# Patient Record
Sex: Female | Born: 1990 | Race: Black or African American | Hispanic: No | Marital: Single | Smoking: Never smoker
Health system: Southern US, Community
[De-identification: ages and names within clinical notes are randomized; demographics above are authoritative.]

---

## 2019-12-05 ENCOUNTER — Emergency Department (HOSPITAL_COMMUNITY): Payer: Medicaid Other

## 2019-12-05 ENCOUNTER — Emergency Department (HOSPITAL_COMMUNITY)
Admission: EM | Admit: 2019-12-05 | Discharge: 2019-12-05 | Disposition: A | Discharge status: Home / Self Care | Payer: Medicaid Other | Attending: Emergency Medicine | Admitting: Emergency Medicine

## 2019-12-05 ENCOUNTER — Encounter (HOSPITAL_COMMUNITY): Payer: Self-pay | Admitting: Emergency Medicine

## 2019-12-05 ENCOUNTER — Other Ambulatory Visit: Payer: Self-pay

## 2019-12-05 DIAGNOSIS — M25572 Pain in left ankle and joints of left foot: Secondary | ICD-10-CM | POA: Insufficient documentation

## 2019-12-05 NOTE — ED Provider Notes (Signed)
[facility] EMERGENCY DEPARTMENT Provider Note   CSN: [id number] Arrival date & time: 12/05/19  1638     History Chief Complaint  Patient presents with  . Ankle Pain    Dawn Bender is a 28 y.o. female.  Patient presents with persistent left ankle pain since slipping and falling in the rain yesterday.  No other injuries.  Pain with walking and difficulty bearing weight.        History reviewed. No pertinent past medical history.  There are no problems to display for this patient.   History reviewed. No pertinent surgical history.   OB History   No obstetric history on file.     No family history on file.  Social History   Tobacco Use  . Smoking status: Never Smoker  . Smokeless tobacco: Never Used  Substance Use Topics  . Alcohol use: Never  . Drug use: Never    Home Medications Prior to Admission medications   Not on File    Allergies    Patient has no allergy information on record.  Review of Systems   Review of Systems  Cardiovascular: Negative for chest pain.  Gastrointestinal: Negative for abdominal pain.  Genitourinary: Negative for dysuria and flank pain.  Musculoskeletal: Positive for gait problem and joint swelling. Negative for back pain, neck pain and neck stiffness.  Skin: Negative for rash.  Neurological: Negative for weakness, light-headedness and headaches.    Physical Exam Updated Vital Signs BP 120/88 (BP Location: Right Arm)   Pulse 90   Temp 98.5 F (36.9 C) (Oral)   Resp 14   Ht 5' (1.524 m)   Wt 70.3 kg   LMP 11/11/2019   SpO2 99%   BMI 30.27 kg/m   Physical Exam Vitals and nursing note reviewed.  Cardiovascular:     Rate and Rhythm: Normal rate.  Musculoskeletal:        General: Swelling, tenderness and signs of injury present. No deformity.     Comments: Patient has tenderness mild swelling left lateral malleoli.  No deformity.  No tenderness to proximal tibia or foot.  Neurovascularly  intact.  Skin:    General: Skin is warm.  Neurological:     General: No focal deficit present.     Mental Status: She is alert.  Psychiatric:        Mood and Affect: Mood normal.     ED Results / Procedures / Treatments   Labs (all labs ordered are listed, but only abnormal results are displayed) Labs Reviewed - No data to display  EKG None  Radiology DG Ankle Complete Left  Result Date: 12/05/2019 CLINICAL DATA:  Left ankle injury. EXAM: LEFT ANKLE COMPLETE - 3+ VIEW COMPARISON:  None. FINDINGS: No fracture. No subluxation or dislocation. Ankle mortise is preserved. Mild lateral soft tissue swelling evident. IMPRESSION: Negative. Electronically Signed   By: Misty Stanley M.D.   On: 12/05/2019 17:25    Procedures Procedures (including critical care time)  Medications Ordered in ED Medications - No data to display  ED Course  I have reviewed the triage vital signs and the nursing notes.  Pertinent labs & imaging results that were available during my care of the patient were reviewed by me and considered in my medical decision making (see chart for details).    MDM Rules/Calculators/A&P                     Well-appearing patient with isolated left ankle injury.  X-ray reviewed no acute fracture.  Supportive care crutches given in the ER.  Ace wrap.   Final Clinical Impression(s) / ED Diagnoses Final diagnoses:  Acute left ankle pain    Rx / DC Orders ED Discharge Orders    None       Blane Ohara, MD 12/05/19 2223

## 2019-12-05 NOTE — Discharge Instructions (Addendum)
Use crutches as needed until pain controlled.  Use ice, elevate when sitting, Motrin and Tylenol for pain.  Gradually increase weightbearing.  If no improvement in 1 week see a clinician.

## 2019-12-05 NOTE — ED Notes (Signed)
Ortho tech at bedside 

## 2019-12-05 NOTE — Progress Notes (Signed)
Orthopedic Tech Progress Note Patient Details:  Dawn Bender 1991-06-04 [id number]  Ortho Devices Type of Ortho Device: Crutches Ortho Device/Splint Interventions: Ordered, Application, Adjustment   Post Interventions Patient Tolerated: Well Instructions Provided: Care of device, Adjustment of device   Karolee Stamps 12/05/2019, 10:19 PM

## 2019-12-05 NOTE — ED Triage Notes (Signed)
C/o left ankle pain after slipping and falling in the rain yesterday.  Has not taken anything for pain.

## 2020-01-13 ENCOUNTER — Emergency Department (HOSPITAL_COMMUNITY)
Admission: EM | Admit: 2020-01-13 | Discharge: 2020-01-13 | Disposition: A | Discharge status: Home / Self Care | Payer: Medicaid Other | Attending: Emergency Medicine | Admitting: Emergency Medicine

## 2020-01-13 ENCOUNTER — Encounter (HOSPITAL_COMMUNITY): Payer: Self-pay | Admitting: *Deleted

## 2020-01-13 ENCOUNTER — Other Ambulatory Visit: Payer: Self-pay

## 2020-01-13 DIAGNOSIS — L0291 Cutaneous abscess, unspecified: Secondary | ICD-10-CM

## 2020-01-13 DIAGNOSIS — R2231 Localized swelling, mass and lump, right upper limb: Secondary | ICD-10-CM | POA: Diagnosis present

## 2020-01-13 DIAGNOSIS — L02411 Cutaneous abscess of right axilla: Secondary | ICD-10-CM | POA: Insufficient documentation

## 2020-01-13 MED ORDER — HYDROCODONE-ACETAMINOPHEN 5-325 MG PO TABS
1.0000 | ORAL_TABLET | Freq: Once | ORAL | Status: AC
  Administered 2020-01-13: 1 via ORAL
  Filled 2020-01-13: qty 1

## 2020-01-13 MED ORDER — BUPIVACAINE HCL (PF) 0.5 % IJ SOLN
20.0000 mL | Freq: Once | INTRAMUSCULAR | Status: AC
  Administered 2020-01-13: 20 mL
  Filled 2020-01-13: qty 30

## 2020-01-13 MED ORDER — DOXYCYCLINE HYCLATE 100 MG PO CAPS: 100.0000 mg | ORAL_CAPSULE | Freq: Two times a day (BID) | ORAL | 0 refills | Status: DC

## 2020-01-13 MED ORDER — DOXYCYCLINE HYCLATE 100 MG PO CAPS: 100.0000 mg | ORAL_CAPSULE | Freq: Two times a day (BID) | ORAL | 0 refills | Status: AC

## 2020-01-13 NOTE — ED Notes (Signed)
Pt verbalizes understanding of DC instructions. Pt belongings returned and is ambulatory out of ED.  

## 2020-01-13 NOTE — ED Triage Notes (Signed)
Abscess right axilla x 9 days

## 2020-01-13 NOTE — ED Provider Notes (Addendum)
[facility]-EMERGENCY DEPT Provider Note   CSN: [id number] Arrival date & time: 01/13/20  1745     History Chief Complaint  Patient presents with  . Abscess    Dawn Bender is a 29 y.o. female.  HPI  Patient is 29 year old female with no significant past medical history presented with 9 days of worsening right axial lump that she states is painful, worse with touch, worse with movement of her arm.  Denies any discharge or bleeding.  Denies any history of abscesses in her armpit however states that she has had 5-10 abscesses in the past.  She has never had antibiotics or incision and drainage in the past she states that which is present around.  She states that this morning does not seem to be doing so.  She is up-to-date on tetanus.  She denies any history of immunosuppressive disease HIV, steroid use or cancer.  She states she is otherwise feeling well denies any fevers, chills, nausea, vomiting, diarrhea, headaches, dizziness, abdominal pain.     History reviewed. No pertinent past medical history.  There are no problems to display for this patient.   History reviewed. No pertinent surgical history.   OB History   No obstetric history on file.     No family history on file.  Social History   Tobacco Use  . Smoking status: Never Smoker  . Smokeless tobacco: Never Used  Substance Use Topics  . Alcohol use: Never  . Drug use: Never    Home Medications Prior to Admission medications   Medication Sig Start Date End Date Taking? Authorizing Provider  doxycycline (VIBRAMYCIN) 100 MG capsule Take 1 capsule (100 mg total) by mouth 2 (two) times daily for 7 days. 01/13/20 01/20/20  Gailen Shelter, PA    Allergies    Patient has no known allergies.  Review of Systems   Review of Systems  Constitutional: Negative for chills and fever.  HENT: Negative for congestion.   Eyes: Negative for pain.  Respiratory: Negative for cough and shortness of breath.    Cardiovascular: Negative for chest pain and leg swelling.  Gastrointestinal: Negative for abdominal pain and vomiting.  Genitourinary: Negative for dysuria.  Musculoskeletal: Negative for myalgias.  Skin: Negative for rash.       Abscess of right axilla  Neurological: Negative for dizziness and headaches.    Physical Exam Updated Vital Signs BP 128/74 (BP Location: Left Arm)   Pulse 70   Temp 98.2 F (36.8 C) (Oral)   Resp 16   Ht 5' (1.524 m)   Wt 68 kg   LMP 01/06/2020   SpO2 100%   BMI 29.29 kg/m   Physical Exam Vitals and nursing note reviewed.  Constitutional:      General: She is not in acute distress. HENT:     Head: Normocephalic and atraumatic.     Nose: Nose normal.     Mouth/Throat:     Mouth: Mucous membranes are moist.  Eyes:     General: No scleral icterus. Cardiovascular:     Rate and Rhythm: Normal rate and regular rhythm.     Pulses: Normal pulses.     Heart sounds: Normal heart sounds.  Pulmonary:     Effort: Pulmonary effort is normal. No respiratory distress.     Breath sounds: No wheezing.  Abdominal:     Palpations: Abdomen is soft.     Tenderness: There is no abdominal tenderness.  Musculoskeletal:     Cervical  back: Normal range of motion.     Right lower leg: No edema.     Left lower leg: No edema.  Skin:    General: Skin is warm and dry.     Capillary Refill: Capillary refill takes less than 2 seconds.     Comments: Patient has a right axillary lesion that is approximately 4 cm in diameter.  This area is tender to palpation.  No redness or erythema.  Area is fluctuant.  Neurological:     Mental Status: She is alert. Mental status is at baseline.  Psychiatric:        Mood and Affect: Mood normal.        Behavior: Behavior normal.     ED Results / Procedures / Treatments   Labs (all labs ordered are listed, but only abnormal results are displayed) Labs Reviewed - No data to display  EKG None  Radiology No results  found.  Procedures .Marland KitchenIncision and Drainage  Date/Time: 01/24/2020 9:05 AM Performed by: Tedd Sias, PA Authorized by: Tedd Sias, PA   Consent:    Consent obtained:  Verbal   Consent given by:  Patient   Risks discussed:  Bleeding, incomplete drainage, pain and damage to other organs   Alternatives discussed:  No treatment Universal protocol:    Procedure explained and questions answered to patient or proxy's satisfaction: yes     Relevant documents present and verified: yes     Test results available and properly labeled: yes     Imaging studies available: yes     Required blood products, implants, devices, and special equipment available: yes     Site/side marked: yes     Immediately prior to procedure a time out was called: yes     Patient identity confirmed:  Verbally with patient and arm band Location:    Type:  Abscess   Size:  4cm x 4cm   Location: Right axilla. Pre-procedure details:    Skin preparation:  Betadine Anesthesia (see MAR for exact dosages):    Anesthesia method:  Local infiltration   Local anesthetic:  Bupivacaine 0.5% w/o epi Procedure type:    Complexity:  Complex Procedure details:    Needle aspiration: no     Incision type: 2 straight incisions parallel to each other 1 cm apart.   Incision depth:  Subcutaneous   Scalpel blade:  11   Wound management:  Probed and deloculated, irrigated with saline and extensive cleaning   Drainage:  Purulent   Drainage amount:  Copious   Packing materials:  1/4 in gauze Post-procedure details:    Patient tolerance of procedure:  Tolerated well, no immediate complications Comments:     Loop drainage technique was used with small piece of rubber glove tied between parallel incisions.  Patient instructed on removal in 7 days.   (including critical care time)  Medications Ordered in ED Medications  HYDROcodone-acetaminophen (NORCO/VICODIN) 5-325 MG per tablet 1 tablet (1 tablet Oral Given 01/13/20 2002)   bupivacaine (MARCAINE) 0.5 % injection 20 mL (20 mLs Infiltration Given by Other 01/13/20 2044)    ED Course  I have reviewed the triage vital signs and the nursing notes.  Pertinent labs & imaging results that were available during my care of the patient were reviewed by me and considered in my medical decision making (see chart for details).    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  States that she has had an abscess in her right armpit for 9 days.  Has no systemic symptoms.  Is feeling well.  States that it is uncomfortable to like to have it fixed.  Incision and drainage conducted.  Patient tolerated procedure well see procedure note.  Will prescribe doxycyline.    Patient discharged with the following instructions:  Take a bath/shower TWICE daily for the first 3 days. Remove the loop in 7-10 days (when the drainage stops and the overlying cellulitis resolves).  Please take antibiotic as prescribed.  Patient well-appearing at time of discharge.  It is up-to-date on tetanus vaccination.  Discharged with Tylenol and ibuprofen recommendations and doxycycline.     The medical records were personally reviewed by myself. I personally reviewed all lab results and interpreted all imaging studies and either concurred with their official read or contacted radiology for clarification.   This patient appears reasonably screened and I doubt any other medical condition requiring further workup, evaluation, or treatment in the ED at this time prior to discharge.   Patient's vitals are WNL apart from vital sign abnormalities discussed above, patient is in NAD, and able to ambulate in the ED at their baseline and able to tolerate PO.  Pain has been managed or a plan has been made for home management and has no complaints prior to discharge. Patient is  comfortable with above plan and for discharge at this time. All questions were answered prior to disposition. Results from the ER workup discussed with the patient face to face and all questions answered to the best of my ability. The patient is safe for discharge with strict return precautions. Patient appears safe for discharge with appropriate follow-up. Conveyed my impression with the patient and they voiced understanding and are agreeable to plan.   An After Visit Summary was printed and given to the patient.  Portions of this note were generated with Scientist, clinical (histocompatibility and immunogenetics). Dictation errors may occur despite best attempts at proofreading.     Final Clinical Impression(s) / ED Diagnoses Final diagnoses:  Abscess  Abscess of axilla, right    Rx / DC Orders ED Discharge Orders         Ordered    doxycycline (VIBRAMYCIN) 100 MG capsule  2 times daily,   Status:  Discontinued     01/13/20 2048    doxycycline (VIBRAMYCIN) 100 MG capsule  2 times daily     01/13/20 2048           Gailen Shelter, [place] 01/13/20 2049    Glynn Octave, MD 01/13/20 2333    Gailen Shelter, PA 01/24/20 2229    Glynn Octave, MD 01/24/20 1354

## 2020-01-13 NOTE — Discharge Instructions (Addendum)
Take a bath/shower TWICE daily for the first 3 days. Remove the loop in 7-10 days (when the drainage stops and the overlying cellulitis resolves).   Please take antibiotic as prescribed.

## 2021-05-18 IMAGING — CR DG ANKLE COMPLETE 3+V*L*
3 series · 3 of 3 positions shown · non-contrast
Comparison: None.

CLINICAL DATA: Left ankle injury.

EXAM:
LEFT ANKLE COMPLETE - 3+ VIEW

[ankle ap]
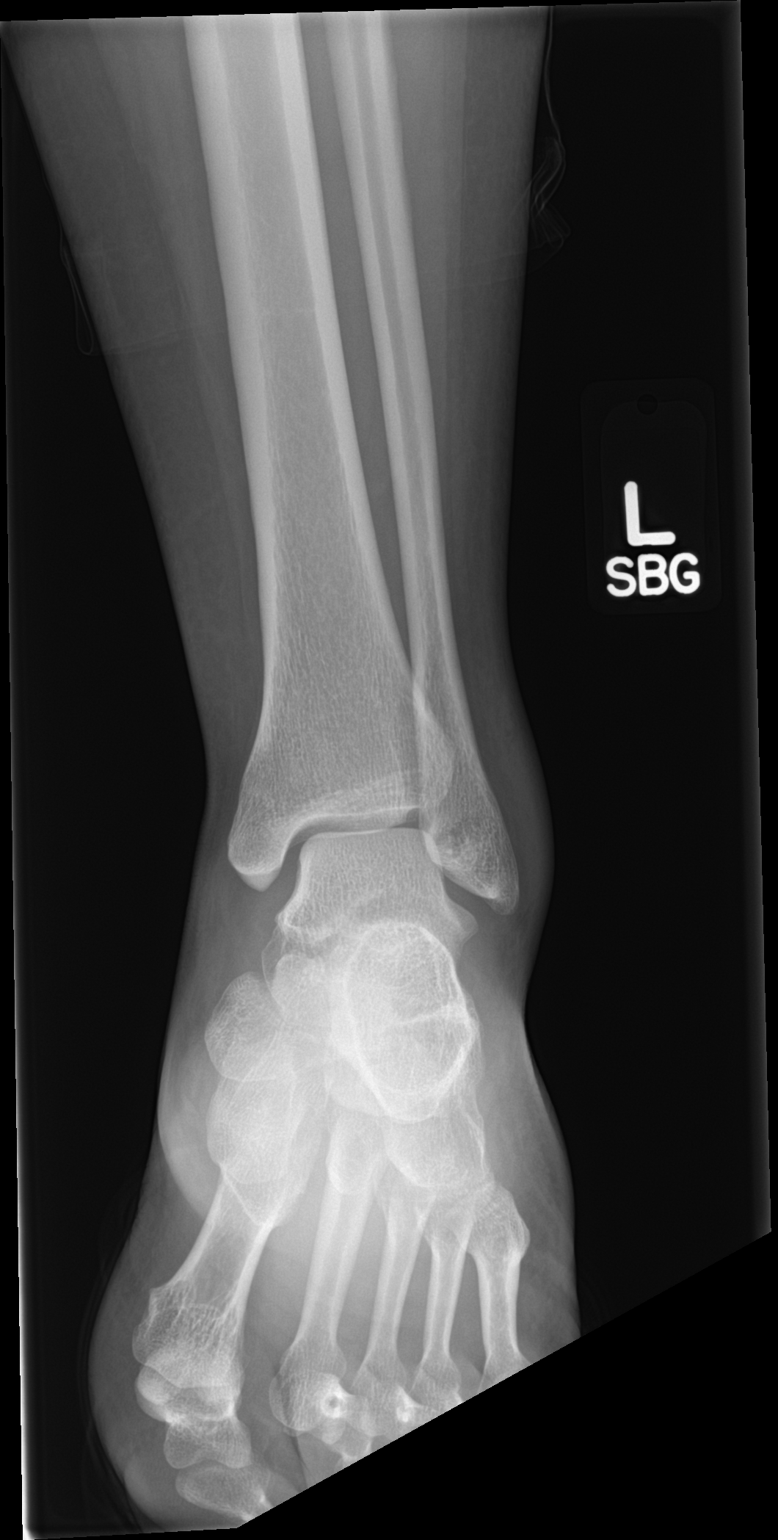

[ankle obl]
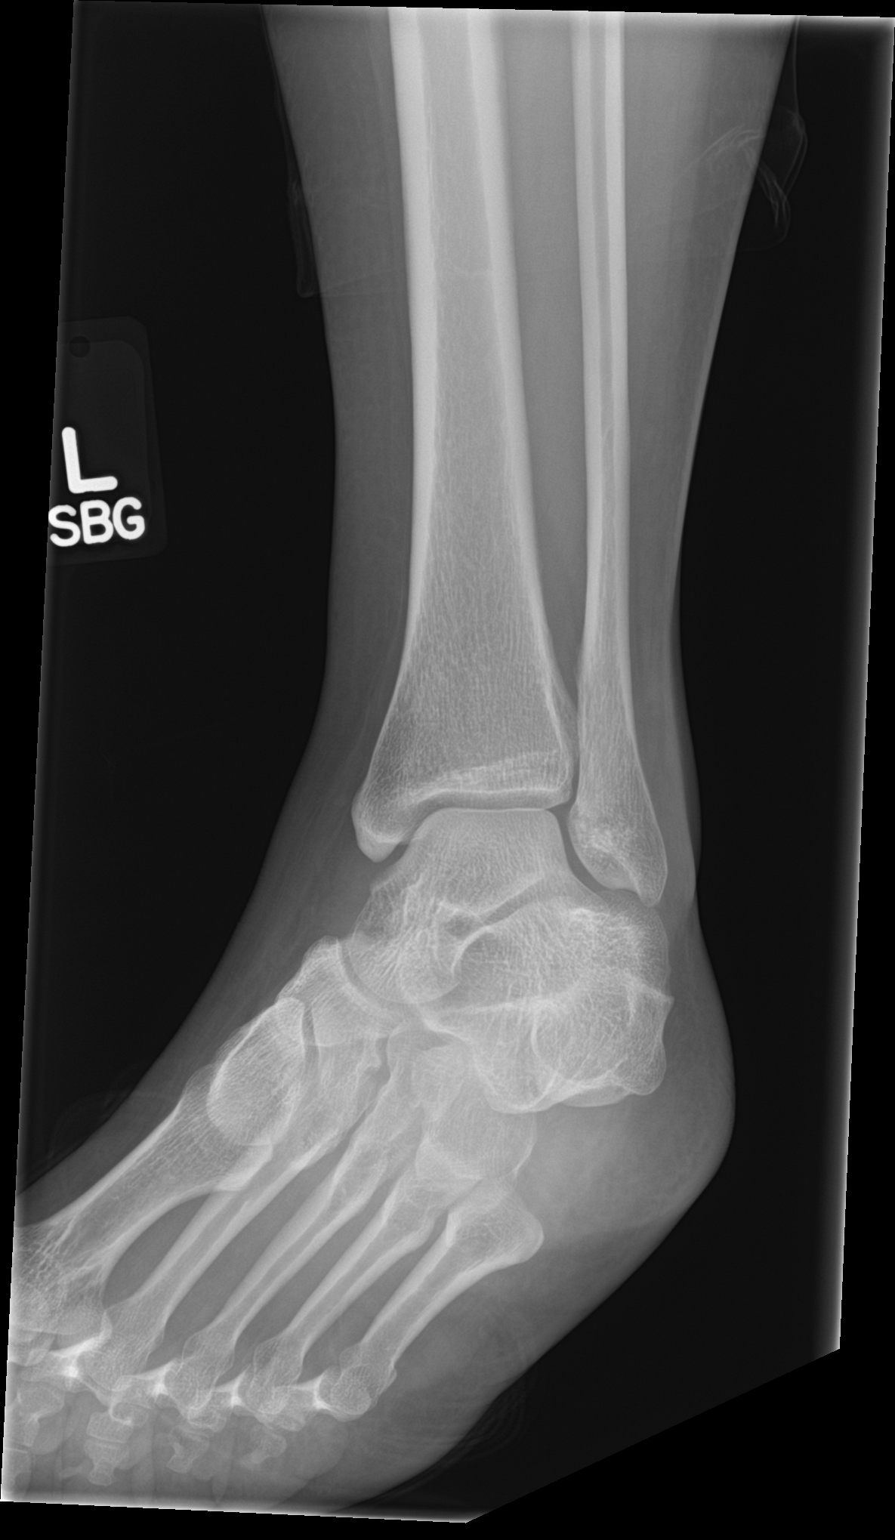

[ankle lat]
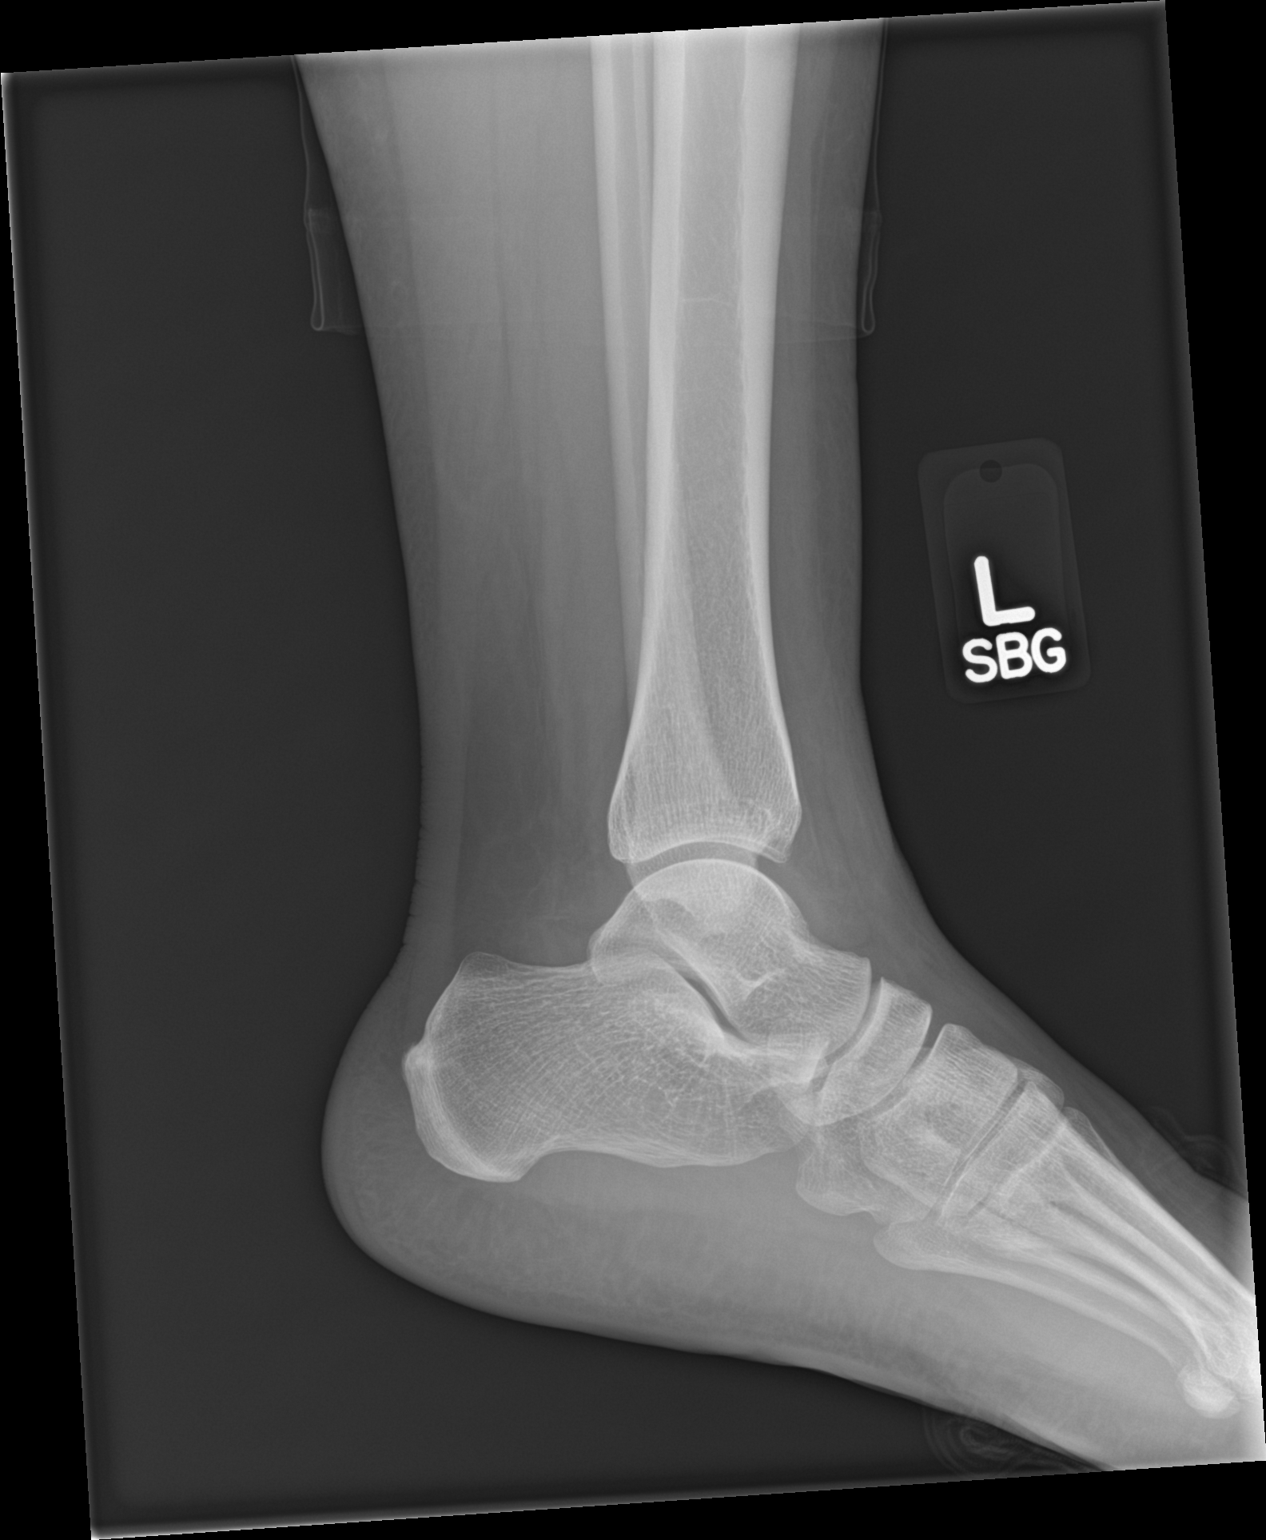

[3 of 3 positions shown; findings below may reference images not displayed]

FINDINGS: No fracture. No subluxation or dislocation. Ankle mortise is
preserved. Mild lateral soft tissue swelling evident.
IMPRESSION: Negative.
